# Patient Record
Sex: Female | Born: 1950 | Race: Asian | Hispanic: No | Marital: Married | Smoking: Never smoker
Health system: Southern US, Community
[De-identification: ages and names within clinical notes are randomized; demographics above are authoritative.]

## PROBLEM LIST (undated history)

## (undated) HISTORY — PX: APPENDECTOMY: SHX54

---

## 2005-07-15 ENCOUNTER — Inpatient Hospital Stay (HOSPITAL_COMMUNITY): Admission: EM | Admit: 2005-07-15 | Discharge: 2005-07-19 | Payer: Self-pay | Admitting: Emergency Medicine

## 2013-03-24 ENCOUNTER — Emergency Department (HOSPITAL_COMMUNITY)
Admission: EM | Admit: 2013-03-24 | Discharge: 2013-03-24 | Disposition: A | Discharge status: Home / Self Care | Payer: No Typology Code available for payment source | Attending: Emergency Medicine | Admitting: Emergency Medicine

## 2013-03-24 ENCOUNTER — Emergency Department (HOSPITAL_COMMUNITY): Payer: No Typology Code available for payment source

## 2013-03-24 ENCOUNTER — Encounter (HOSPITAL_COMMUNITY): Payer: Self-pay | Admitting: *Deleted

## 2013-03-24 DIAGNOSIS — S199XXA Unspecified injury of neck, initial encounter: Secondary | ICD-10-CM | POA: Insufficient documentation

## 2013-03-24 DIAGNOSIS — S298XXA Other specified injuries of thorax, initial encounter: Secondary | ICD-10-CM | POA: Insufficient documentation

## 2013-03-24 DIAGNOSIS — Y92410 Unspecified street and highway as the place of occurrence of the external cause: Secondary | ICD-10-CM | POA: Insufficient documentation

## 2013-03-24 DIAGNOSIS — R51 Headache: Secondary | ICD-10-CM | POA: Insufficient documentation

## 2013-03-24 DIAGNOSIS — S0993XA Unspecified injury of face, initial encounter: Secondary | ICD-10-CM | POA: Insufficient documentation

## 2013-03-24 DIAGNOSIS — Y9389 Activity, other specified: Secondary | ICD-10-CM | POA: Insufficient documentation

## 2013-03-24 DIAGNOSIS — T148XXA Other injury of unspecified body region, initial encounter: Secondary | ICD-10-CM | POA: Insufficient documentation

## 2013-03-24 LAB — URINALYSIS, ROUTINE W REFLEX MICROSCOPIC
Bilirubin Urine: NEGATIVE
Hgb urine dipstick: NEGATIVE
Ketones, ur: NEGATIVE mg/dL
Protein, ur: NEGATIVE mg/dL
Specific Gravity, Urine: 1.012 (ref 1.005–1.030)
Urobilinogen, UA: 0.2 mg/dL (ref 0.0–1.0)
pH: 6.5 (ref 5.0–8.0)

## 2013-03-24 LAB — COMPREHENSIVE METABOLIC PANEL
ALT: 20 U/L (ref 0–35)
AST: 24 U/L (ref 0–37)
Albumin: 3.6 g/dL (ref 3.5–5.2)
Alkaline Phosphatase: 90 U/L (ref 39–117)
BUN: 13 mg/dL (ref 6–23)
CO2: 26 mEq/L (ref 19–32)
Calcium: 9.6 mg/dL (ref 8.4–10.5)
Chloride: 104 mEq/L (ref 96–112)
GFR calc Af Amer: >90 mL/min (ref >90)
GFR calc non Af Amer: >90 mL/min (ref >90)
Glucose, Bld: 107 mg/dL — ABNORMAL HIGH (ref 70–99)
Sodium: 141 mEq/L (ref 135–145)
Total Bilirubin: 0.2 mg/dL — ABNORMAL LOW (ref 0.3–1.2)

## 2013-03-24 LAB — CBC WITH DIFFERENTIAL/PLATELET
Eosinophils Absolute: 0.4 10*3/uL (ref 0.0–0.7)
Hemoglobin: 14.3 g/dL (ref 12.0–15.0)
Lymphocytes Relative: 30 % (ref 12–46)
Lymphs Abs: 2.5 10*3/uL (ref 0.7–4.0)
Monocytes Relative: 4 % (ref 3–12)
Neutro Abs: 5.1 10*3/uL (ref 1.7–7.7)
Neutrophils Relative %: 61 % (ref 43–77)
Platelets: 285 10*3/uL (ref 150–400)
RBC: 5.45 MIL/uL — ABNORMAL HIGH (ref 3.87–5.11)

## 2013-03-24 LAB — PROTIME-INR: INR: 0.85 (ref 0.00–1.49)

## 2013-03-24 MED ORDER — ONDANSETRON HCL 4 MG/2ML IJ SOLN
4.0000 mg | Freq: Once | INTRAMUSCULAR | Status: AC
  Administered 2013-03-24: 4 mg via INTRAVENOUS
  Filled 2013-03-24: qty 2

## 2013-03-24 MED ORDER — IOHEXOL 300 MG/ML  SOLN
100.0000 mL | Freq: Once | INTRAMUSCULAR | Status: AC | PRN
  Administered 2013-03-24: 100 mL via INTRAVENOUS

## 2013-03-24 MED ORDER — TRAMADOL HCL 50 MG PO TABS: 50.0000 mg | ORAL_TABLET | Freq: Four times a day (QID) | ORAL | Status: DC | PRN
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
## 2013-03-24 NOTE — ED Notes (Signed)
Patient transported to CT 

## 2013-03-24 NOTE — ED Notes (Signed)
C collar removed, Polina, MD aware

## 2014-05-25 ENCOUNTER — Encounter (HOSPITAL_COMMUNITY): Payer: Self-pay | Admitting: Radiology

## 2014-05-25 ENCOUNTER — Emergency Department (HOSPITAL_COMMUNITY): Payer: No Typology Code available for payment source

## 2014-05-25 ENCOUNTER — Emergency Department (HOSPITAL_COMMUNITY)
Admission: EM | Admit: 2014-05-25 | Discharge: 2014-05-26 | Disposition: A | Discharge status: Home / Self Care | Payer: No Typology Code available for payment source | Attending: Emergency Medicine | Admitting: Emergency Medicine

## 2014-05-25 DIAGNOSIS — A084 Viral intestinal infection, unspecified: Secondary | ICD-10-CM

## 2014-05-25 DIAGNOSIS — R112 Nausea with vomiting, unspecified: Secondary | ICD-10-CM

## 2014-05-25 DIAGNOSIS — R1033 Periumbilical pain: Secondary | ICD-10-CM | POA: Insufficient documentation

## 2014-05-25 DIAGNOSIS — R197 Diarrhea, unspecified: Secondary | ICD-10-CM

## 2014-05-25 DIAGNOSIS — A088 Other specified intestinal infections: Secondary | ICD-10-CM | POA: Insufficient documentation

## 2014-05-25 DIAGNOSIS — Z9089 Acquired absence of other organs: Secondary | ICD-10-CM | POA: Insufficient documentation

## 2014-05-25 LAB — COMPREHENSIVE METABOLIC PANEL
ALBUMIN: 4.1 g/dL (ref 3.5–5.2)
ALT: 18 U/L (ref 0–35)
AST: 21 U/L (ref 0–37)
Alkaline Phosphatase: 86 U/L (ref 39–117)
BUN: 20 mg/dL (ref 6–23)
CHLORIDE: 102 meq/L (ref 96–112)
CO2: 22 meq/L (ref 19–32)
CREATININE: 0.71 mg/dL (ref 0.50–1.10)
Calcium: 10.2 mg/dL (ref 8.4–10.5)
GFR calc Af Amer: >90 mL/min (ref >90)
Glucose, Bld: 162 mg/dL — ABNORMAL HIGH (ref 70–99)
Potassium: 4.3 mEq/L (ref 3.7–5.3)
Sodium: 142 mEq/L (ref 137–147)
Total Bilirubin: 0.3 mg/dL (ref 0.3–1.2)
Total Protein: 8.3 g/dL (ref 6.0–8.3)

## 2014-05-25 LAB — CBC WITH DIFFERENTIAL/PLATELET
BASOS ABS: 0 10*3/uL (ref 0.0–0.1)
BASOS PCT: 0 % (ref 0–1)
Eosinophils Absolute: 0.2 10*3/uL (ref 0.0–0.7)
Eosinophils Relative: 1 % (ref 0–5)
HEMATOCRIT: 44.3 % (ref 36.0–46.0)
HEMOGLOBIN: 14.9 g/dL (ref 12.0–15.0)
LYMPHS ABS: 1.1 10*3/uL (ref 0.7–4.0)
LYMPHS PCT: 6 % — AB (ref 12–46)
MCH: 26.4 pg (ref 26.0–34.0)
MCHC: 33.6 g/dL (ref 30.0–36.0)
MCV: 78.5 fL (ref 78.0–100.0)
MONO ABS: 1.6 10*3/uL — AB (ref 0.1–1.0)
MONOS PCT: 9 % (ref 3–12)
NEUTROS ABS: 15.4 10*3/uL — AB (ref 1.7–7.7)
Neutrophils Relative %: 84 % — ABNORMAL HIGH (ref 43–77)
PLATELETS: 240 10*3/uL (ref 150–400)
RBC: 5.64 MIL/uL — AB (ref 3.87–5.11)
RDW: 13.8 % (ref 11.5–15.5)
WBC: 18.4 10*3/uL — AB (ref 4.0–10.5)

## 2014-05-25 LAB — URINALYSIS, ROUTINE W REFLEX MICROSCOPIC
BILIRUBIN URINE: NEGATIVE
Glucose, UA: NEGATIVE mg/dL
HGB URINE DIPSTICK: NEGATIVE
KETONES UR: NEGATIVE mg/dL
Leukocytes, UA: NEGATIVE
NITRITE: NEGATIVE
PH: 6.5 (ref 5.0–8.0)
PROTEIN: NEGATIVE mg/dL
SPECIFIC GRAVITY, URINE: 1.014 (ref 1.005–1.030)
UROBILINOGEN UA: 0.2 mg/dL (ref 0.0–1.0)

## 2014-05-25 LAB — LIPASE, BLOOD: Lipase: 38 U/L (ref 11–59)

## 2014-05-25 MED ORDER — IOHEXOL 300 MG/ML  SOLN
100.0000 mL | Freq: Once | INTRAMUSCULAR | Status: AC | PRN
  Administered 2014-05-25: 100 mL via INTRAVENOUS

## 2014-05-25 MED ORDER — SODIUM CHLORIDE 0.9 % IV BOLUS (SEPSIS)
1000.0000 mL | Freq: Once | INTRAVENOUS | Status: AC
  Administered 2014-05-25: 1000 mL via INTRAVENOUS

## 2014-05-25 MED ORDER — IOHEXOL 300 MG/ML  SOLN
20.0000 mL | INTRAMUSCULAR | Status: AC
  Administered 2014-05-25: 20 mL via ORAL

## 2014-05-25 NOTE — ED Provider Notes (Signed)
CSN: [id number]     Arrival date & time 05/25/14  1906 History   First MD Initiated Contact with Patient 05/25/14 2009     Chief Complaint  Patient presents with  . Abdominal Pain  . Emesis  . Diarrhea   HPI  History provided by patient's family. Patient is a 63 year old non-English-speaking female who presents with episodes of nausea vomiting and diarrhea. Family reports that patient began having vomiting and diarrhea symptoms around 4 PM. Diarrhea is watery without blood or mucus. No hematemesis. She did have a very active day and going to church in the morning and then to several cookouts. She does not think she had any undercooked or spoiled foods. She was also outside doing yardwork and was complaining of feeling very dehydrated and weak. She denies having any chest pain or shortness of breath. Denies any fever, chills or sweats. Denies any sick contacts. Patient was transported to the emergency room by EMS and was given Zofran and route. She does feel some improvements of nausea following this.    No past medical history on file. Past Surgical History  Procedure Laterality Date  . Appendectomy     No family history on file. History  Substance Use Topics  . Smoking status: Never Smoker   . Smokeless tobacco: Not on file  . Alcohol Use: No   OB History   Grav Para Term Preterm Abortions TAB SAB Ect Mult Living                 Review of Systems  Constitutional: Negative for fever, chills and diaphoresis.  Respiratory: Negative for shortness of breath.   Cardiovascular: Negative for chest pain.  Gastrointestinal: Positive for nausea, vomiting, abdominal pain and diarrhea. Negative for blood in stool.  Genitourinary: Negative for dysuria, frequency, hematuria and flank pain.  Musculoskeletal: Negative for back pain.  All other systems reviewed and are negative.     Allergies  Review of patient's allergies indicates no known allergies.  Home Medications   Prior to  Admission medications   Not on File   BP 131/73  Pulse 87  Temp(Src) 97.7 F (36.5 C) (Oral)  Resp 20  Ht 5' (1.524 m)  Wt 150 lb (68.04 kg)  BMI 29.30 kg/m2  SpO2 100% Physical Exam  Nursing note and vitals reviewed. Constitutional: She is oriented to person, place, and time. She appears well-developed and well-nourished. No distress.  HENT:  Head: Normocephalic.  Mouth/Throat: Oropharynx is clear and moist.  Neck: Normal range of motion. Neck supple.  Cardiovascular: Normal rate and regular rhythm.   Pulmonary/Chest: Effort normal and breath sounds normal. No respiratory distress. She has no wheezes. She has no rales.  Abdominal: Soft. There is tenderness in the periumbilical area and suprapubic area. There is no rigidity, no rebound, no guarding, no CVA tenderness, no tenderness at McBurney's point and negative Murphy's sign.  Mild tenderness.  Musculoskeletal: Normal range of motion.  Neurological: She is alert and oriented to person, place, and time.  Skin: Skin is warm and dry. No rash noted.  Psychiatric: She has a normal mood and affect. Her behavior is normal.    ED Course  Procedures  COORDINATION OF CARE:  Nursing notes reviewed. Vital signs reviewed. Initial pt interview and examination performed.   Filed Vitals:   05/25/14 1909 05/25/14 2000 05/25/14 2039  BP: 136/81 123/59 131/73  Pulse: 88 89 87  Temp: 97.7 F (36.5 C)    TempSrc: Oral    Resp:  16 21 20   Height: 5' (1.524 m)    Weight: 150 lb (68.04 kg)    SpO2: 98% 99% 100%    8:40PM-patient seen and evaluated. Patient resting appears well in no acute distress. Does not appear severely ill or toxic. Afebrile. Normal heart rate and blood pressure.  Patient feeling much better after IV fluids and Zofran. Patient does have elevated WBC. Some lower abdomen tenderness. We'll order CT scan to rule out diverticulitis.  CT scan unremarkable. No diverticulitis. No other concerning findings on laboratory  tests. Patient continues to be feeling well. We'll discharge home with symptomatic treatment for nausea vomiting. Strict return precautions given.   Treatment plan initiated: Medications  sodium chloride 0.9 % bolus 1,000 mL (1,000 mLs Intravenous New Bag/Given 05/25/14 2054)    Results for orders placed during the hospital encounter of 05/25/14  CBC WITH DIFFERENTIAL      Result Value Ref Range   WBC 18.4 (*) 4.0 - 10.5 K/uL   RBC 5.64 (*) 3.87 - 5.11 MIL/uL   Hemoglobin 14.9  12.0 - 15.0 g/dL   HCT 16.144.3  09.636.0 - 04.546.0 %   MCV 78.5  78.0 - 100.0 fL   MCH 26.4  26.0 - 34.0 pg   MCHC 33.6  30.0 - 36.0 g/dL   RDW 40.913.8  81.111.5 - 91.415.5 %   Platelets 240  150 - 400 K/uL   Neutrophils Relative % 84 (*) 43 - 77 %   Neutro Abs 15.4 (*) 1.7 - 7.7 K/uL   Lymphocytes Relative 6 (*) 12 - 46 %   Lymphs Abs 1.1  0.7 - 4.0 K/uL   Monocytes Relative 9  3 - 12 %   Monocytes Absolute 1.6 (*) 0.1 - 1.0 K/uL   Eosinophils Relative 1  0 - 5 %   Eosinophils Absolute 0.2  0.0 - 0.7 K/uL   Basophils Relative 0  0 - 1 %   Basophils Absolute 0.0  0.0 - 0.1 K/uL  COMPREHENSIVE METABOLIC PANEL      Result Value Ref Range   Sodium 142  137 - 147 mEq/L   Potassium 4.3  3.7 - 5.3 mEq/L   Chloride 102  96 - 112 mEq/L   CO2 22  19 - 32 mEq/L   Glucose, Bld 162 (*) 70 - 99 mg/dL   BUN 20  6 - 23 mg/dL   Creatinine, Ser 7.820.71  0.50 - 1.10 mg/dL   Calcium 95.610.2  8.4 - 21.310.5 mg/dL   Total Protein 8.3  6.0 - 8.3 g/dL   Albumin 4.1  3.5 - 5.2 g/dL   AST 21  0 - 37 U/L   ALT 18  0 - 35 U/L   Alkaline Phosphatase 86  39 - 117 U/L   Total Bilirubin 0.3  0.3 - 1.2 mg/dL   GFR calc non Af Amer >90  >90 mL/min   GFR calc Af Amer >90  >90 mL/min  LIPASE, BLOOD      Result Value Ref Range   Lipase 38  11 - 59 U/L  URINALYSIS, ROUTINE W REFLEX MICROSCOPIC      Result Value Ref Range   Color, Urine YELLOW  YELLOW   APPearance CLEAR  CLEAR   Specific Gravity, Urine 1.014  1.005 - 1.030   pH 6.5  5.0 - 8.0   Glucose,  UA NEGATIVE  NEGATIVE mg/dL   Hgb urine dipstick NEGATIVE  NEGATIVE   Bilirubin Urine NEGATIVE  NEGATIVE   Ketones,  ur NEGATIVE  NEGATIVE mg/dL   Protein, ur NEGATIVE  NEGATIVE mg/dL   Urobilinogen, UA 0.2  0.0 - 1.0 mg/dL   Nitrite NEGATIVE  NEGATIVE   Leukocytes, UA NEGATIVE  NEGATIVE     Imaging Review Ct Abdomen Pelvis W Contrast  05/26/2014   CLINICAL DATA:  Abdominal pain.  Diarrhea.  EXAM: CT ABDOMEN AND PELVIS WITH CONTRAST  TECHNIQUE: Multidetector CT imaging of the abdomen and pelvis was performed using the standard protocol following bolus administration of intravenous contrast.  CONTRAST:  100mL OMNIPAQUE IOHEXOL 300 MG/ML  SOLN  COMPARISON:  03/24/2013.  FINDINGS: Bones:  No aggressive osseous lesions.  Lung Bases: Dependent atelectasis.  Liver:  Normal.  Spleen:  Normal.  Gallbladder: Cholelithiasis. No inflammatory changes of the gallbladder.  Common bile duct:  Normal.  Pancreas:  Normal.  Adrenal glands:  Normal bilaterally.  Kidneys: 2 mm nonobstructing left upper pole renal collecting system calculus. Left ureter appears within normal limits. Right ureter also appears within normal limits. Right inferior pole renal scarring is present. Subcentimeter bilateral low-density lesions are present compatible with cysts. Unchanged appearance of the right kidney compared to prior.  Stomach: Patulous gastroesophageal junction. No inflammatory changes of stomach.  Small bowel: Normal appearance of the duodenum. Small bowel is normal. No mesenteric adenopathy. No inflammatory changes or obstruction.  Colon: Colonic diverticulosis without diverticulitis. Appendix not identified. No right lower quadrant inflammatory changes.  Pelvic Genitourinary:  Normal.  Vasculature: Atherosclerosis.  No acute vascular abnormality.  Body Wall: Normal.  IMPRESSION: 1. No acute abnormality. 2. Cholelithiasis. 3. Unchanged right renal scarring and tiny low-density the renal lesions likely representing cysts.    Electronically Signed   By: Andreas NewportGeoffrey  Lamke M.D.   On: 05/26/2014 00:21     EKG Interpretation None      MDM   Final diagnoses:  Nausea vomiting and diarrhea  Viral gastroenteritis        Angus Sellereter S Kalieb Freeland, PA-C 05/26/14 0030

## 2014-05-25 NOTE — ED Notes (Signed)
Patient was unable to catch any of her urine in the cup. No specimen obtained at this time.

## 2014-05-25 NOTE — ED Notes (Signed)
CT notified patient finished with contrast 

## 2014-05-25 NOTE — ED Notes (Signed)
To room via EMS.  Pt went to several cookouts today.  Onset 4pm abd pain, vomiting >10, diarrhea x 2. EMS gave Zofran 4mg  for dry heaves.  CBG 121, BP 146/90 HR 90.

## 2014-05-26 MED ORDER — ONDANSETRON 8 MG PO TBDP: ORAL_TABLET | ORAL | Status: AC

## 2014-05-26 NOTE — ED Provider Notes (Signed)
Medical screening examination/treatment/procedure(s) were performed by non-physician practitioner and as supervising physician I was immediately available for consultation/collaboration.   EKG Interpretation None        Courtney Rollins M Clementine Soulliere, MD 05/26/14 0032 

## 2014-05-26 NOTE — Discharge Instructions (Signed)
You were seen and evaluated for your abdominal pain, nausea, vomiting and diarrhea symptoms. Your laboratory testing and CAT scan had not shown any signs for a concerning or emergent cause of your symptoms. Drink plenty of fluids to stay hydrated. Followup with a primary care provider for continued evaluation and treatment. Return at any time for changing or worsening symptoms.    Viral Gastroenteritis Viral gastroenteritis is also known as stomach flu. This condition affects the stomach and intestinal tract. It can cause sudden diarrhea and vomiting. The illness typically lasts 3 to 8 days. Most people develop an immune response that eventually gets rid of the virus. While this natural response develops, the virus can make you quite ill. CAUSES  Many different viruses can cause gastroenteritis, such as rotavirus or noroviruses. You can catch one of these viruses by consuming contaminated food or water. You may also catch a virus by sharing utensils or other personal items with an infected person or by touching a contaminated surface. SYMPTOMS  The most common symptoms are diarrhea and vomiting. These problems can cause a severe loss of body fluids (dehydration) and a body salt (electrolyte) imbalance. Other symptoms may include:  Fever.  Headache.  Fatigue.  Abdominal pain. DIAGNOSIS  Your caregiver can usually diagnose viral gastroenteritis based on your symptoms and a physical exam. A stool sample may also be taken to test for the presence of viruses or other infections. TREATMENT  This illness typically goes away on its own. Treatments are aimed at rehydration. The most serious cases of viral gastroenteritis involve vomiting so severely that you are not able to keep fluids down. In these cases, fluids must be given through an intravenous line (IV). HOME CARE INSTRUCTIONS   Drink enough fluids to keep your urine clear or pale yellow. Drink small amounts of fluids frequently and increase  the amounts as tolerated.  Ask your caregiver for specific rehydration instructions.  Avoid:  Foods high in sugar.  Alcohol.  Carbonated drinks.  Tobacco.  Juice.  Caffeine drinks.  Extremely hot or cold fluids.  Fatty, greasy foods.  Too much intake of anything at one time.  Dairy products until 24 to 48 hours after diarrhea stops.  You may consume probiotics. Probiotics are active cultures of beneficial bacteria. They may lessen the amount and number of diarrheal stools in adults. Probiotics can be found in yogurt with active cultures and in supplements.  Wash your hands well to avoid spreading the virus.  Only take over-the-counter or prescription medicines for pain, discomfort, or fever as directed by your caregiver. Do not give aspirin to children. Antidiarrheal medicines are not recommended.  Ask your caregiver if you should continue to take your regular prescribed and over-the-counter medicines.  Keep all follow-up appointments as directed by your caregiver. SEEK IMMEDIATE MEDICAL CARE IF:   You are unable to keep fluids down.  You do not urinate at least once every 6 to 8 hours.  You develop shortness of breath.  You notice blood in your stool or vomit. This may look like coffee grounds.  You have abdominal pain that increases or is concentrated in one small area (localized).  You have persistent vomiting or diarrhea.  You have a fever.  The patient is a child younger than 3 months, and he or she has a fever.  The patient is a child older than 3 months, and he or she has a fever and persistent symptoms.  The patient is a child older than 3 months,  and he or she has a fever and symptoms suddenly get worse.  The patient is a baby, and he or she has no tears when crying. MAKE SURE YOU:   Understand these instructions.  Will watch your condition.  Will get help right away if you are not doing well or get worse. Document Released: 11/28/2005  Document Revised: 02/20/2012 Document Reviewed: 09/14/2011 [facility] Patient Information 2014 [place], [place].
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
## 2015-12-07 IMAGING — CT CT ABD-PELV W/ CM
2 of 5 series · 15 of 46 positions shown, 17 images · IV contrast (Omni 300)
Comparison: 03/24/2013.

CLINICAL DATA: Abdominal pain.  Diarrhea.

EXAM:
CT ABDOMEN AND PELVIS WITH CONTRAST
TECHNIQUE: Multidetector CT imaging of the abdomen and pelvis was performed
using the standard protocol following bolus administration of
intravenous contrast.
CONTRAST:  100mL OMNIPAQUE IOHEXOL 300 MG/ML  SOLN

[Series 2: abd/ pelvis 5.0 i30f 1 · axial · 0.70mm/px · z∈[+216,+642]mm · 12 of 95 slices shown, 14 images]
[im 5/95  soft-tissue]
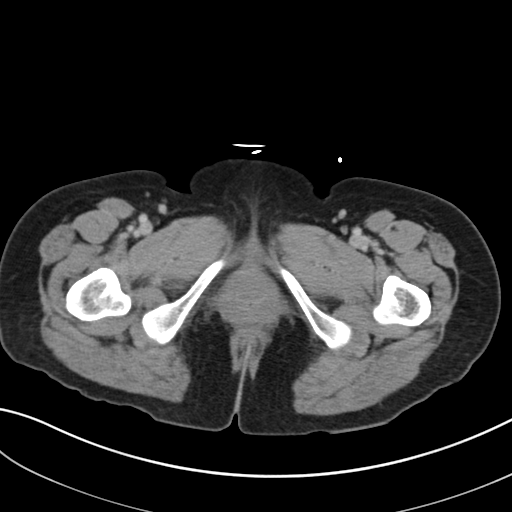
[im 5/95  bone]
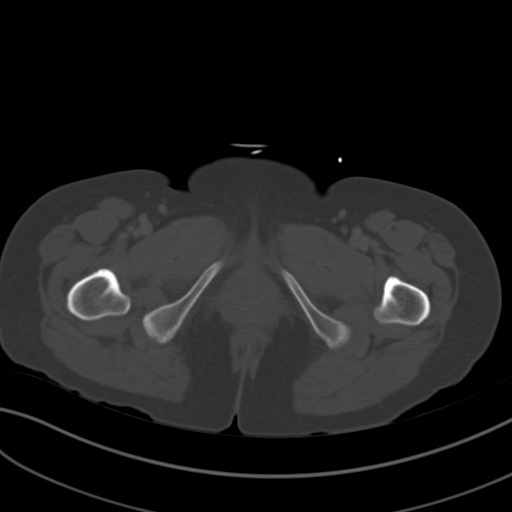
[im 15/95  soft-tissue]
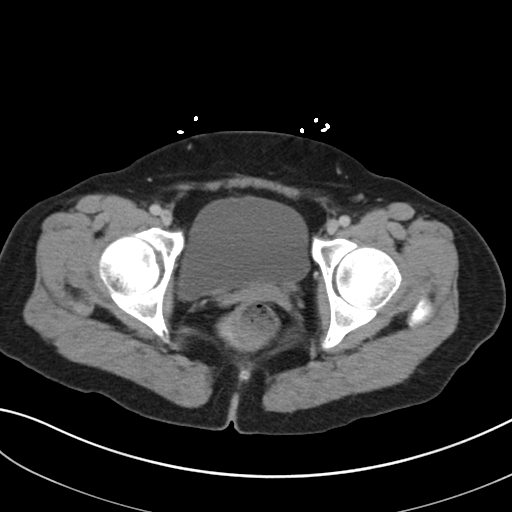
[im 20/95  soft-tissue]
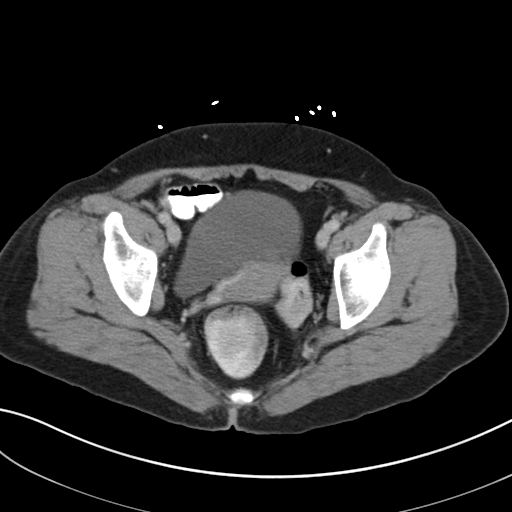
[im 30/95  soft-tissue]
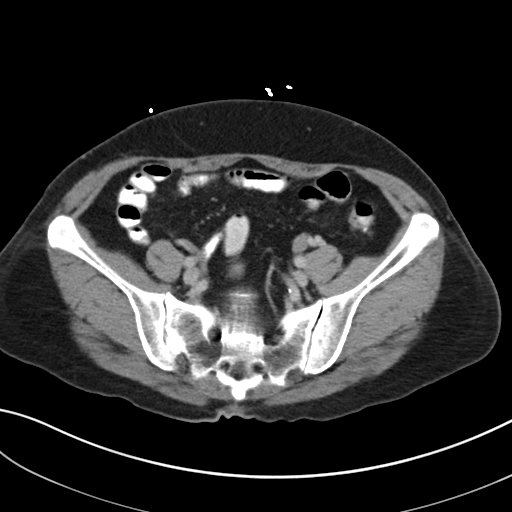
[im 35/95  soft-tissue]
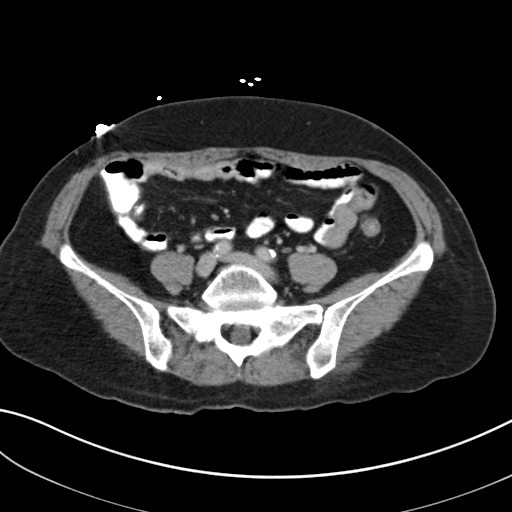
[im 45/95  soft-tissue]
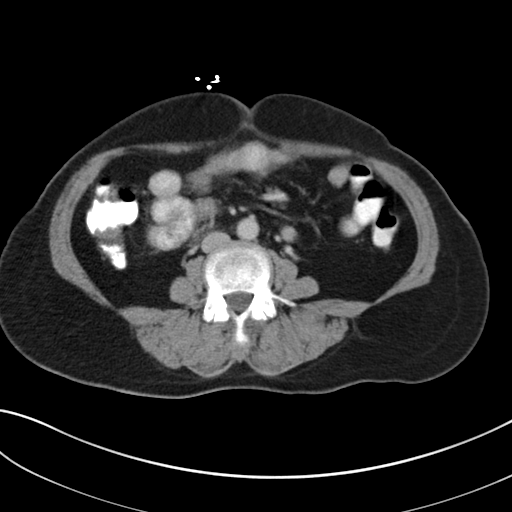
[im 50/95  soft-tissue]
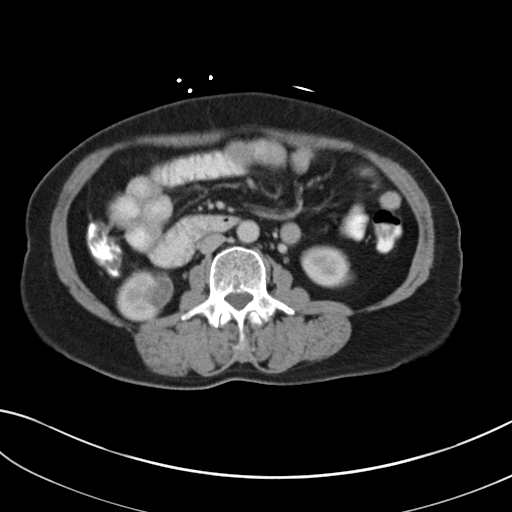
[im 60/95  soft-tissue]
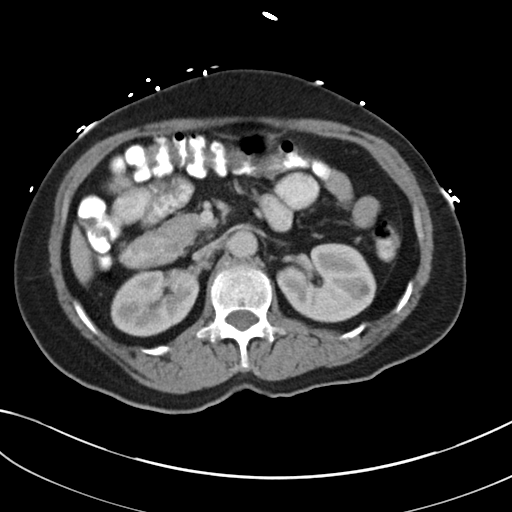
[im 65/95  soft-tissue]
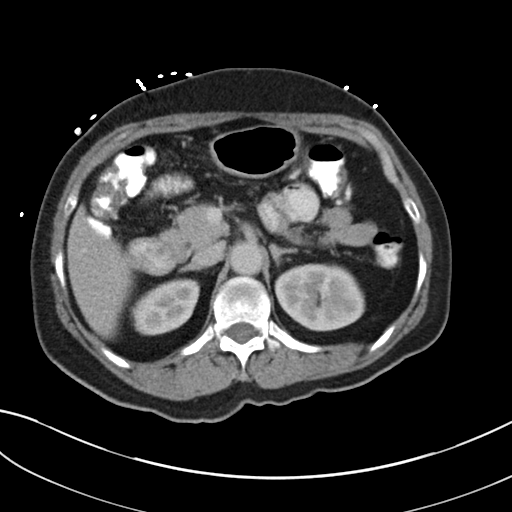
[im 65/95  bone]
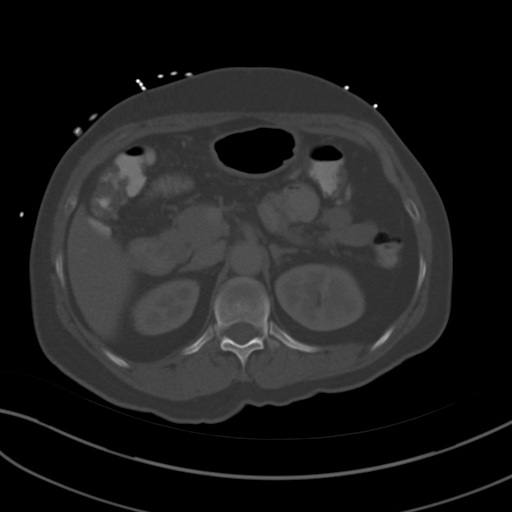
[im 75/95  soft-tissue]
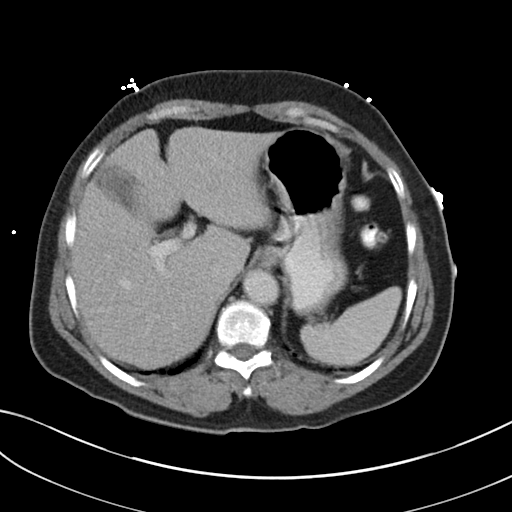
[im 80/95  soft-tissue]
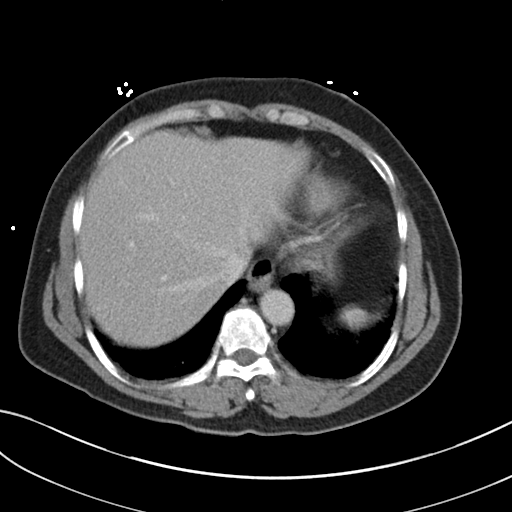
[im 90/95  soft-tissue]
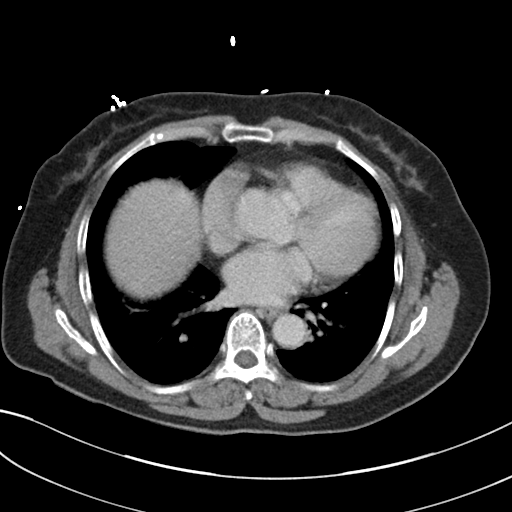

[Series 5: coronals · coronal · 0.57mm/px · 3 of 102 slices shown]
[im 34/102  soft-tissue]
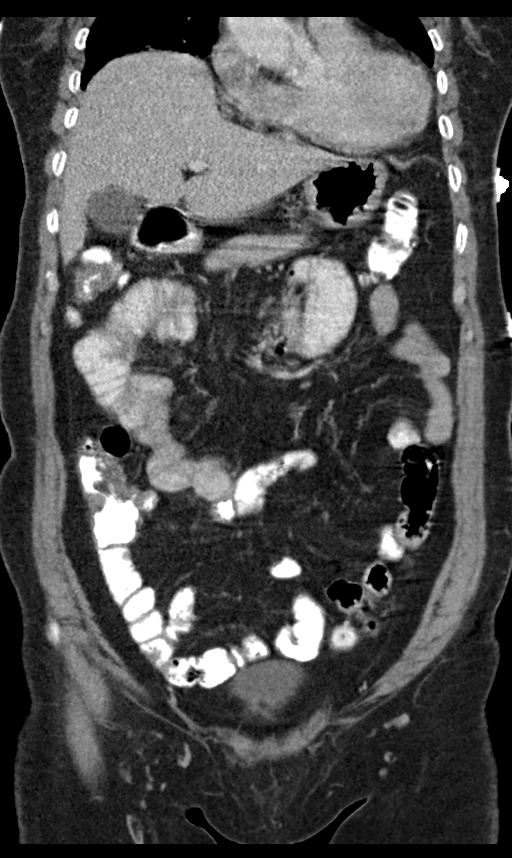
[im 45/102  soft-tissue]
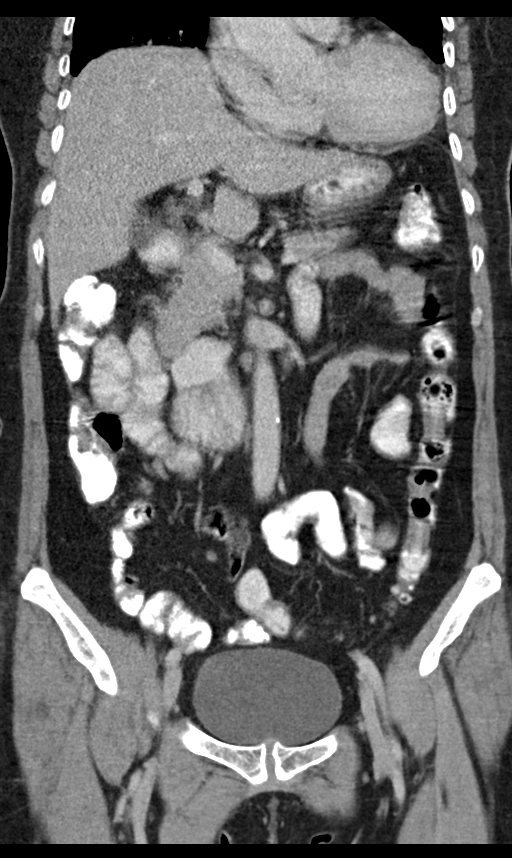
[im 57/102  soft-tissue]
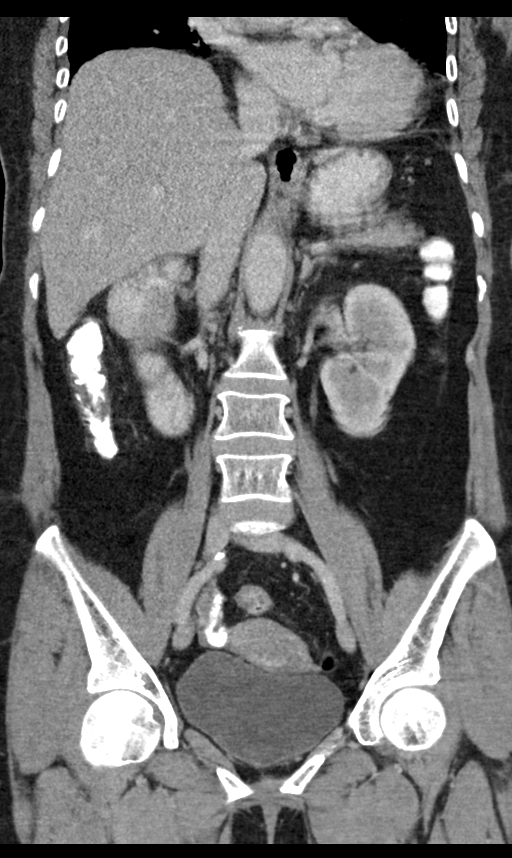

[15 of 46 positions shown; findings below may reference images not displayed]

FINDINGS: Bones:  No aggressive osseous lesions.

Lung Bases: Dependent atelectasis.

Liver:  Normal.

Spleen:  Normal.

Gallbladder: Cholelithiasis. No inflammatory changes of the
gallbladder.

Common bile duct:  Normal.

Pancreas:  Normal.

Adrenal glands:  Normal bilaterally.

Kidneys: 2 mm nonobstructing left upper pole renal collecting system
calculus. Left ureter appears within normal limits. Right ureter
also appears within normal limits. Right inferior pole renal
scarring is present. Subcentimeter bilateral low-density lesions are
present compatible with cysts. Unchanged appearance of the right
kidney compared to prior.

Stomach: Patulous gastroesophageal junction. No inflammatory changes
of stomach.

Small bowel: Normal appearance of the duodenum. Small bowel is
normal. No mesenteric adenopathy. No inflammatory changes or
obstruction.

Colon: Colonic diverticulosis without diverticulitis. Appendix not
identified. No right lower quadrant inflammatory changes.

Pelvic Genitourinary:  Normal.

Vasculature: Atherosclerosis.  No acute vascular abnormality.

Body Wall: Normal.
IMPRESSION: 1. No acute abnormality.
2. Cholelithiasis.
3. Unchanged right renal scarring and tiny low-density the renal
lesions likely representing cysts.

## 2020-01-27 ENCOUNTER — Ambulatory Visit: Payer: Medicare Other | Attending: Internal Medicine

## 2020-01-27 DIAGNOSIS — Z23 Encounter for immunization: Secondary | ICD-10-CM | POA: Insufficient documentation

## 2020-01-27 NOTE — Progress Notes (Signed)
   Covid-19 Vaccination Clinic  Name:  Courtney Rollins    MRN: [id number] DOB: 09/10/1951  01/27/2020  [name] was observed post Covid-19 immunization for 15 minutes without incidence. She was provided with Vaccine Information Sheet and instruction to access the V-Safe system.   [name] was instructed to call 911 with any severe reactions post vaccine: Marland Kitchen Difficulty breathing  . Swelling of your face and throat  . A fast heartbeat  . A bad rash all over your body  . Dizziness and weakness    Immunizations Administered    Name Date Dose VIS Date Route   Pfizer COVID-19 Vaccine 01/27/2020  4:21 PM 0.3 mL 11/22/2019 Intramuscular   Manufacturer: [employer], [employer]   Lot: [id number]   NDC: [address]-2

## 2020-02-18 ENCOUNTER — Ambulatory Visit: Payer: Medicare Other | Attending: Internal Medicine

## 2020-02-19 ENCOUNTER — Ambulatory Visit: Payer: Medicare Other

## 2020-02-24 ENCOUNTER — Ambulatory Visit: Payer: Medicare Other | Attending: Internal Medicine

## 2020-02-24 DIAGNOSIS — Z23 Encounter for immunization: Secondary | ICD-10-CM

## 2020-02-24 NOTE — Progress Notes (Signed)
   Covid-19 Vaccination Clinic  Name:  Ruweyda Macknight    MRN: [id number] DOB: 04-May-1951  02/24/2020  Ms. Dresden was observed post Covid-19 immunization for 15 minutes without incident. She was provided with Vaccine Information Sheet and instruction to access the V-Safe system.   Ms. Hillegass was instructed to call 911 with any severe reactions post vaccine: Marland Kitchen Difficulty breathing  . Swelling of face and throat  . A fast heartbeat  . A bad rash all over body  . Dizziness and weakness   Immunizations Administered    Name Date Dose VIS Date Route   Pfizer COVID-19 Vaccine 02/24/2020  9:20 AM 0.3 mL 11/22/2019 Intramuscular   Manufacturer: [employer], [employer]   Lot: [id number]   NDC: [address]-4

## 2022-10-08 ENCOUNTER — Ambulatory Visit: Payer: Medicare Other

## 2022-10-08 DIAGNOSIS — Z23 Encounter for immunization: Secondary | ICD-10-CM
